# Patient Record
Sex: Male | Born: 1940 | Race: White | Hispanic: No | Marital: Married | Smoking: Former smoker
Health system: Southern US, Community
[De-identification: ages and names within clinical notes are randomized; demographics above are authoritative.]

## PROBLEM LIST (undated history)

## (undated) DIAGNOSIS — I10 Essential (primary) hypertension: Secondary | ICD-10-CM

## (undated) HISTORY — PX: KNEE SURGERY: SHX244

## (undated) HISTORY — PX: HIP SURGERY: SHX245

---

## 2006-02-08 ENCOUNTER — Encounter: Admission: RE | Admit: 2006-02-08 | Discharge: 2006-02-08 | Payer: Self-pay | Admitting: Family Medicine

## 2006-10-04 ENCOUNTER — Encounter: Admission: RE | Admit: 2006-10-04 | Discharge: 2006-10-04 | Payer: Self-pay | Admitting: Family Medicine

## 2007-06-28 ENCOUNTER — Encounter: Admission: RE | Admit: 2007-06-28 | Discharge: 2007-06-28 | Payer: Self-pay | Admitting: Family Medicine

## 2020-11-19 ENCOUNTER — Other Ambulatory Visit: Payer: Self-pay

## 2020-11-19 ENCOUNTER — Emergency Department (HOSPITAL_BASED_OUTPATIENT_CLINIC_OR_DEPARTMENT_OTHER): Payer: Medicare Other

## 2020-11-19 ENCOUNTER — Encounter (HOSPITAL_BASED_OUTPATIENT_CLINIC_OR_DEPARTMENT_OTHER): Payer: Self-pay

## 2020-11-19 ENCOUNTER — Emergency Department (HOSPITAL_BASED_OUTPATIENT_CLINIC_OR_DEPARTMENT_OTHER)
Admission: EM | Admit: 2020-11-19 | Discharge: 2020-11-20 | Disposition: A | Discharge status: Home / Self Care | Payer: Medicare Other | Attending: Emergency Medicine | Admitting: Emergency Medicine

## 2020-11-19 DIAGNOSIS — R42 Dizziness and giddiness: Secondary | ICD-10-CM | POA: Insufficient documentation

## 2020-11-19 DIAGNOSIS — R5383 Other fatigue: Secondary | ICD-10-CM | POA: Insufficient documentation

## 2020-11-19 DIAGNOSIS — I10 Essential (primary) hypertension: Secondary | ICD-10-CM | POA: Diagnosis not present

## 2020-11-19 DIAGNOSIS — Z87891 Personal history of nicotine dependence: Secondary | ICD-10-CM | POA: Insufficient documentation

## 2020-11-19 DIAGNOSIS — R0602 Shortness of breath: Secondary | ICD-10-CM | POA: Diagnosis present

## 2020-11-19 HISTORY — DX: Essential (primary) hypertension: I10

## 2020-11-19 LAB — URINALYSIS, ROUTINE W REFLEX MICROSCOPIC
Bilirubin Urine: NEGATIVE
Glucose, UA: NEGATIVE mg/dL
Hgb urine dipstick: NEGATIVE
Ketones, ur: NEGATIVE mg/dL
Leukocytes,Ua: NEGATIVE
Nitrite: NEGATIVE
Protein, ur: NEGATIVE mg/dL
Specific Gravity, Urine: 1.02 (ref 1.005–1.030)
pH: 6 (ref 5.0–8.0)

## 2020-11-19 LAB — D-DIMER, QUANTITATIVE: D-Dimer, Quant: 2.28 ug/mL-FEU — ABNORMAL HIGH (ref 0.00–0.50)

## 2020-11-19 LAB — TROPONIN I (HIGH SENSITIVITY): Troponin I (High Sensitivity): 5 ng/L (ref <18)

## 2020-11-19 LAB — CBC
HCT: 40.4 % (ref 39.0–52.0)
Hemoglobin: 13.6 g/dL (ref 13.0–17.0)
MCH: 30.2 pg (ref 26.0–34.0)
MCHC: 33.7 g/dL (ref 30.0–36.0)
MCV: 89.6 fL (ref 80.0–100.0)
Platelets: 276 10*3/uL (ref 150–400)
RBC: 4.51 MIL/uL (ref 4.22–5.81)
RDW: 13.6 % (ref 11.5–15.5)
WBC: 7.8 10*3/uL (ref 4.0–10.5)
nRBC: 0 % (ref 0.0–0.2)

## 2020-11-19 LAB — BASIC METABOLIC PANEL
Anion gap: 11 (ref 5–15)
BUN: 15 mg/dL (ref 8–23)
CO2: 21 mmol/L — ABNORMAL LOW (ref 22–32)
Calcium: 9.4 mg/dL (ref 8.9–10.3)
Chloride: 105 mmol/L (ref 98–111)
Creatinine, Ser: 1.13 mg/dL (ref 0.61–1.24)
GFR, Estimated: >60 mL/min (ref >60)
Glucose, Bld: 104 mg/dL — ABNORMAL HIGH (ref 70–99)
Potassium: 3.8 mmol/L (ref 3.5–5.1)
Sodium: 137 mmol/L (ref 135–145)

## 2020-11-19 MED ORDER — IOHEXOL 350 MG/ML SOLN
100.0000 mL | Freq: Once | INTRAVENOUS | Status: AC | PRN
  Administered 2020-11-19: 100 mL via INTRAVENOUS

## 2020-11-19 MED ORDER — HYDROCORTISONE NA SUCCINATE PF 250 MG IJ SOLR: 200.0000 mg | Freq: Once | INTRAMUSCULAR | Status: DC

## 2020-11-19 MED ORDER — DIPHENHYDRAMINE HCL 25 MG PO CAPS: 50.0000 mg | ORAL_CAPSULE | Freq: Once | ORAL | Status: AC

## 2020-11-19 MED ORDER — HYDROCORTISONE NA SUCCINATE PF 100 MG IJ SOLR
200.0000 mg | Freq: Once | INTRAMUSCULAR | Status: AC
  Administered 2020-11-19: 200 mg via INTRAVENOUS
  Filled 2020-11-19: qty 4

## 2020-11-19 MED ORDER — DIPHENHYDRAMINE HCL 50 MG/ML IJ SOLN
50.0000 mg | Freq: Once | INTRAMUSCULAR | Status: AC
  Administered 2020-11-19: 50 mg via INTRAVENOUS
  Filled 2020-11-19: qty 1

## 2020-11-19 NOTE — ED Triage Notes (Addendum)
Pt c/o feeling lightheaded, "woozy", SOB x 2 weeks-denies pain at present-pain to left UE 2 nights ago-pt c/o hearing and vision changes after knee surgery 10/28/20-NAD-steady gait

## 2020-11-19 NOTE — ED Notes (Signed)
ED Provider at bedside. 

## 2020-11-20 NOTE — ED Provider Notes (Signed)
[facility] EMERGENCY DEPARTMENT Provider Note   CSN: [id number] Arrival date & time: 11/19/20  1630     History Chief Complaint  Patient presents with  . Dizziness    Charles Cruz is a 80 y.o. male.  Presenting to the emergency room with concern for shortness of breath.  Patient states that over the past couple weeks he has felt somewhat fatigued, shortness of breath and somewhat lightheaded.  He currently has no acute complaints.  States that he has not had any associated chest pain.  Knee replacement surgery in November.  HPI     Past Medical History:  Diagnosis Date  . Hypertension     There are no problems to display for this patient.   Past Surgical History:  Procedure Laterality Date  . HIP SURGERY    . KNEE SURGERY         No family history on file.  Social History   Tobacco Use  . Smoking status: Former Games developer  . Smokeless tobacco: Never Used  Substance Use Topics  . Alcohol use: Yes    Comment: one/daily  . Drug use: Never    Home Medications Prior to Admission medications   Not on File    Allergies    Contrast media [iodinated diagnostic agents], Metrizamide, Other, Rivaroxaban, Atorvastatin, Celecoxib, Coenzyme q10, Lansoprazole, Pravastatin, Sulfa antibiotics, Valdecoxib, and Iohexol  Review of Systems   Review of Systems  Constitutional: Positive for fatigue. Negative for chills and fever.  HENT: Negative for ear pain and sore throat.   Eyes: Negative for pain and visual disturbance.  Respiratory: Positive for shortness of breath. Negative for cough.   Cardiovascular: Negative for chest pain and palpitations.  Gastrointestinal: Negative for abdominal pain and vomiting.  Genitourinary: Negative for dysuria and hematuria.  Musculoskeletal: Negative for arthralgias and back pain.  Skin: Negative for color change and rash.  Neurological: Positive for light-headedness. Negative for seizures and syncope.  All other systems  reviewed and are negative.   Physical Exam Updated Vital Signs BP (!) 159/79   Pulse 88   Temp (!) 97.4 F (36.3 C) (Oral)   Resp (!) 25   Ht 5\' 10"  (1.778 m)   Wt 9.072 kg   SpO2 95%   BMI 2.87 kg/m   Physical Exam Vitals and nursing note reviewed.  Constitutional:      Appearance: He is well-developed and well-nourished.  HENT:     Head: Normocephalic and atraumatic.  Eyes:     Conjunctiva/sclera: Conjunctivae normal.  Cardiovascular:     Rate and Rhythm: Normal rate and regular rhythm.     Heart sounds: No murmur heard.   Pulmonary:     Effort: Pulmonary effort is normal. No respiratory distress.     Breath sounds: Normal breath sounds.  Abdominal:     Palpations: Abdomen is soft.     Tenderness: There is no abdominal tenderness.  Musculoskeletal:        General: No deformity, signs of injury or edema.     Cervical back: Neck supple.  Skin:    General: Skin is warm and dry.  Neurological:     General: No focal deficit present.     Mental Status: He is alert and oriented to person, place, and time.  Psychiatric:        Mood and Affect: Mood and affect and mood normal.        Behavior: Behavior normal.     ED Results / Procedures /  Treatments   Labs (all labs ordered are listed, but only abnormal results are displayed) Labs Reviewed  BASIC METABOLIC PANEL - Abnormal; Notable for the following components:      Result Value   CO2 21 (*)    Glucose, Bld 104 (*)    All other components within normal limits  D-DIMER, QUANTITATIVE (NOT AT [facility]) - Abnormal; Notable for the following components:   D-Dimer, Quant 2.28 (*)    All other components within normal limits  CBC  URINALYSIS, ROUTINE W REFLEX MICROSCOPIC  TROPONIN I (HIGH SENSITIVITY)    EKG EKG Interpretation  Date/Time:  Wednesday November 19 2020 16:31:11 EST Ventricular Rate:  99 PR Interval:  184 QRS Duration: 134 QT Interval:  396 QTC Calculation: 508 R Axis:   -84 Text  Interpretation: Sinus rhythm with frequent Premature ventricular complexes and Fusion complexes Right bundle branch block Left anterior fascicular block  Bifascicular block  Abnormal ECG no prior for comparison Confirmed by Marianna Fuss ([id number]) on 11/19/2020 6:09:33 PM   Radiology DG Chest Portable 1 View  Result Date: 11/19/2020 CLINICAL DATA:  Cough, shortness of breath EXAM: PORTABLE CHEST 1 VIEW COMPARISON:  03/21/2018 FINDINGS: Heart is normal size. Bibasilar linear densities are stable since prior study compatible with scarring. Tortuous aorta with scattered calcifications. No acute confluent opacities or effusions. No acute bony abnormality. IMPRESSION: Bibasilar scarring. No active disease. Electronically Signed   By: Charlett Nose M.D.   On: 11/19/2020 17:12    Procedures Procedures   Medications Ordered in ED Medications  diphenhydrAMINE (BENADRYL) capsule 50 mg ( Oral See Alternative 11/19/20 2312)    Or  diphenhydrAMINE (BENADRYL) injection 50 mg (50 mg Intravenous Given 11/19/20 2312)  hydrocortisone sodium succinate (SOLU-CORTEF) 100 MG injection 200 mg (200 mg Intravenous Given 11/19/20 2012)  iohexol (OMNIPAQUE) 350 MG/ML injection 100 mL (100 mLs Intravenous Contrast Given 11/19/20 2359)    ED Course  I have reviewed the triage vital signs and the nursing notes.  Pertinent labs & imaging results that were available during my care of the patient were reviewed by me and considered in my medical decision making (see chart for details).    MDM Rules/Calculators/A&P                         80 year old male presenting to ER with concern for some shortness of breath and episodes of feeling lightheaded.  At present patient is well-appearing in no acute distress, no ongoing symptoms.  Given recent Ortho surgery, symptomatology, check D-dimer which was elevated.  CTA chest ordered to rule out PE.  The remainder of his labs were grossly stable.  If CT negative, anticipate discharge  home with plan to follow-up with primary.  Signed out to [facility] pending reassessment and CT results.   Final Clinical Impression(s) / ED Diagnoses Final diagnoses:  Shortness of breath    Rx / DC Orders ED Discharge Orders    None       Milagros Loll, MD 11/20/20 [phone]

## 2020-11-20 NOTE — ED Notes (Signed)
Patient transported to CT 

## 2020-11-20 NOTE — ED Provider Notes (Signed)
Nursing notes and vitals signs, including pulse oximetry, reviewed.  Summary of this visit's results, reviewed by myself:   EKG Interpretation  Date/Time:  Wednesday November 19 2020 16:31:11 EST Ventricular Rate:  99 PR Interval:  184 QRS Duration: 134 QT Interval:  396 QTC Calculation: 508 R Axis:   -84 Text Interpretation: Sinus rhythm with frequent Premature ventricular complexes and Fusion complexes Right bundle branch block Left anterior fascicular block  Bifascicular block  Abnormal ECG no prior for comparison Confirmed by Marianna Fuss ([id number]) on 11/19/2020 6:09:33 PM        Labs:  Results for orders placed or performed during the hospital encounter of 11/19/20 (from the past 24 hour(s))  Basic metabolic panel     Status: Abnormal   Collection Time: 11/19/20  4:50 PM  Result Value Ref Range   Sodium 137 135 - 145 mmol/L   Potassium 3.8 3.5 - 5.1 mmol/L   Chloride 105 98 - 111 mmol/L   CO2 21 (L) 22 - 32 mmol/L   Glucose, Bld 104 (H) 70 - 99 mg/dL   BUN 15 8 - 23 mg/dL   Creatinine, Ser 9.73 0.61 - 1.24 mg/dL   Calcium 9.4 8.9 - 53.2 mg/dL   GFR, Estimated >99 >24 mL/min   Anion gap 11 5 - 15  CBC     Status: None   Collection Time: 11/19/20  4:50 PM  Result Value Ref Range   WBC 7.8 4.0 - 10.5 K/uL   RBC 4.51 4.22 - 5.81 MIL/uL   Hemoglobin 13.6 13.0 - 17.0 g/dL   HCT 26.8 34.1 - 96.2 %   MCV 89.6 80.0 - 100.0 fL   MCH 30.2 26.0 - 34.0 pg   MCHC 33.7 30.0 - 36.0 g/dL   RDW 22.9 79.8 - 92.1 %   Platelets 276 150 - 400 K/uL   nRBC 0.0 0.0 - 0.2 %  Urinalysis, Routine w reflex microscopic     Status: None   Collection Time: 11/19/20  4:50 PM  Result Value Ref Range   Color, Urine YELLOW YELLOW   APPearance CLEAR CLEAR   Specific Gravity, Urine 1.020 1.005 - 1.030   pH 6.0 5.0 - 8.0   Glucose, UA NEGATIVE NEGATIVE mg/dL   Hgb urine dipstick NEGATIVE NEGATIVE   Bilirubin Urine NEGATIVE NEGATIVE   Ketones, ur NEGATIVE NEGATIVE mg/dL   Protein, ur NEGATIVE  NEGATIVE mg/dL   Nitrite NEGATIVE NEGATIVE   Leukocytes,Ua NEGATIVE NEGATIVE  Troponin I (High Sensitivity)     Status: None   Collection Time: 11/19/20  4:50 PM  Result Value Ref Range   Troponin I (High Sensitivity) 5 <18 ng/L  D-dimer, quantitative (not at [facility])     Status: Abnormal   Collection Time: 11/19/20  7:31 PM  Result Value Ref Range   D-Dimer, Quant 2.28 (H) 0.00 - 0.50 ug/mL-FEU    Imaging Studies: CT Angio Chest PE W and/or Wo Contrast  Result Date: 11/20/2020 CLINICAL DATA:  Shortness of breath. EXAM: CT ANGIOGRAPHY CHEST WITH CONTRAST TECHNIQUE: Multidetector CT imaging of the chest was performed using the standard protocol during bolus administration of intravenous contrast. Multiplanar CT image reconstructions and MIPs were obtained to evaluate the vascular anatomy. CONTRAST:  OMNIPAQUE IOHEXOL 350 MG/ML SOLN COMPARISON:  CT chest dated 06/28/2007. CT chest dated Mar 19, 2009. FINDINGS: Cardiovascular: Contrast injection is sufficient to demonstrate satisfactory opacification of the pulmonary arteries to the segmental level. There is no pulmonary embolus or evidence of right heart  strain. The size of the main pulmonary artery is normal. Cardiomegaly with coronary artery calcification. The course and caliber of the aorta are normal. There is mild atherosclerotic calcification. Opacification decreased due to pulmonary arterial phase contrast bolus timing. Mediastinum/Nodes: -- No mediastinal lymphadenopathy. -- No hilar lymphadenopathy. -- No axillary lymphadenopathy. -- No supraclavicular lymphadenopathy. -- Normal thyroid gland where visualized. -  Unremarkable esophagus. Lungs/Pleura: Emphysematous changes are noted. There are few ground-glass airspace opacities bilaterally, most evident in the bilateral lower lobes. There is no pneumothorax. The trachea is unremarkable. There is no significant pleural effusion. Upper Abdomen: Contrast bolus timing is not optimized for  evaluation of the abdominal organs. The visualized portions of the organs of the upper abdomen are normal. Musculoskeletal: No chest wall abnormality. No bony spinal canal stenosis. Review of the MIP images confirms the above findings. IMPRESSION: 1. No evidence of pulmonary embolus. 2. There are few ground-glass airspace opacities bilaterally, most evident in the bilateral lower lobes, concerning for an atypical infectious process such as viral pneumonia. 3. Cardiomegaly with coronary artery disease. Aortic Atherosclerosis (ICD10-I70.0) and Emphysema (ICD10-J43.9). Electronically Signed   By: Katherine Mantle M.D.   On: 11/20/2020 00:31   DG Chest Portable 1 View  Result Date: 11/19/2020 CLINICAL DATA:  Cough, shortness of breath EXAM: PORTABLE CHEST 1 VIEW COMPARISON:  03/21/2018 FINDINGS: Heart is normal size. Bibasilar linear densities are stable since prior study compatible with scarring. Tortuous aorta with scattered calcifications. No acute confluent opacities or effusions. No acute bony abnormality. IMPRESSION: Bibasilar scarring. No active disease. Electronically Signed   By: Charlett Nose M.D.   On: 11/19/2020 17:12      Keely Drennan, Jonny Ruiz, MD 11/20/20 [phone]

## 2022-02-01 IMAGING — CT CT ANGIO CHEST
2 of 9 series · 18 of 36 positions shown · IV contrast (Omnipaque)
Comparison: CT chest dated 06/28/2007. CT chest dated March 19, 2009.

CLINICAL DATA: Shortness of breath.

EXAM:
CT ANGIOGRAPHY CHEST WITH CONTRAST
TECHNIQUE: Multidetector CT imaging of the chest was performed using the
standard protocol during bolus administration of intravenous
contrast. Multiplanar CT image reconstructions and MIPs were
obtained to evaluate the vascular anatomy.
CONTRAST:  100mL OMNIPAQUE IOHEXOL 350 MG/ML SOLN

[Series 8: pe thins · axial · 0.96mm/px · z∈[+30,+300]mm · 17 of 301 slices shown]
[im 16/301  lung]
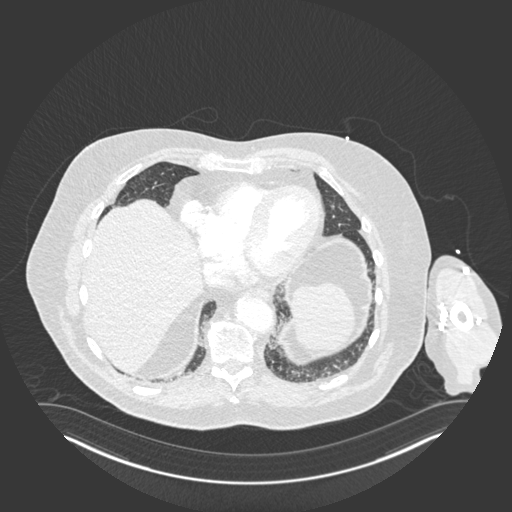
[im 32/301  mediastinal]
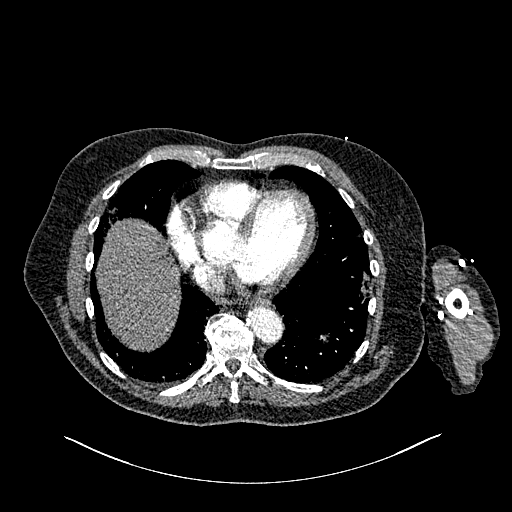
[im 48/301  lung]
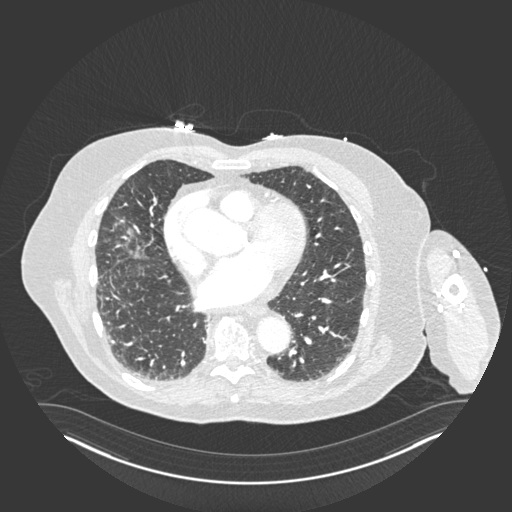
[im 64/301  mediastinal]
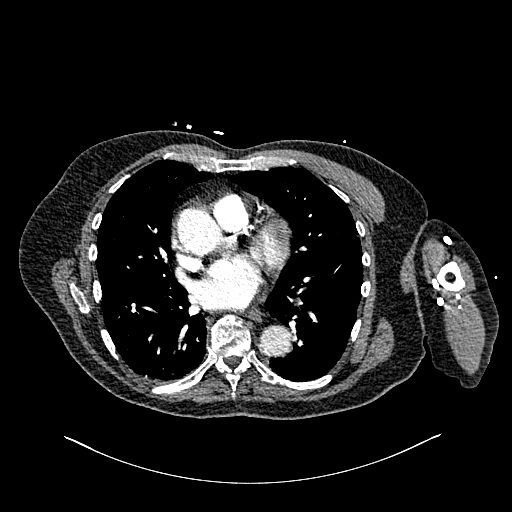
[im 79/301  lung]
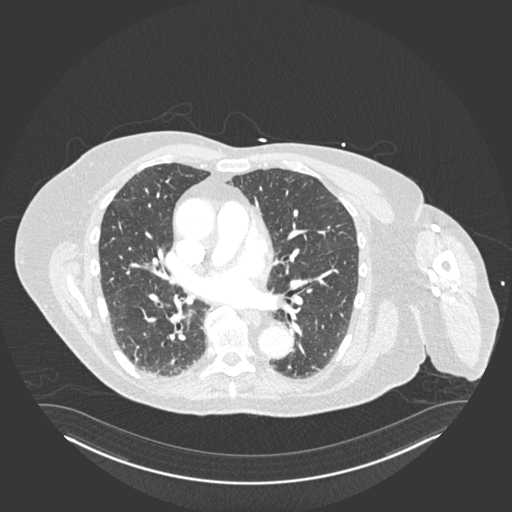
[im 95/301  mediastinal]
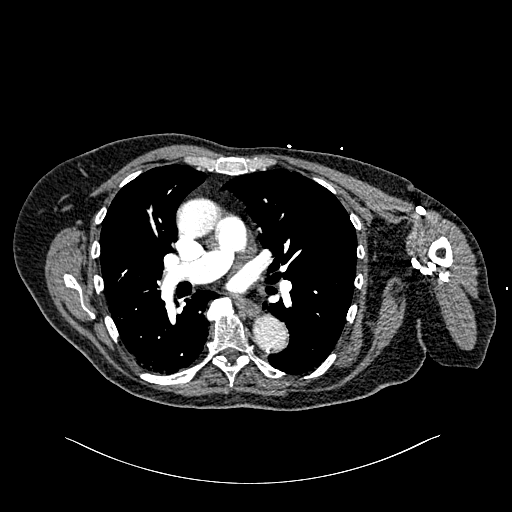
[im 111/301  lung]
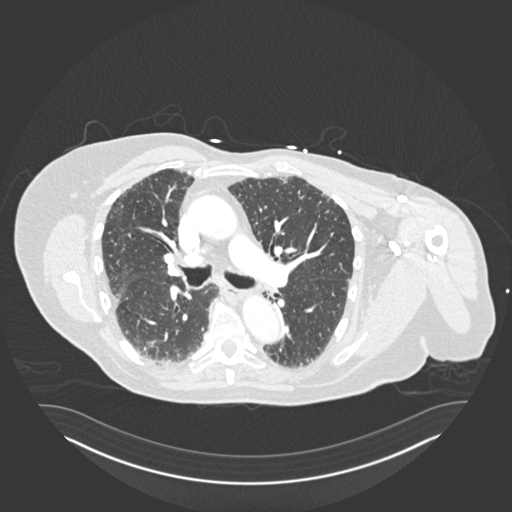
[im 127/301  mediastinal]
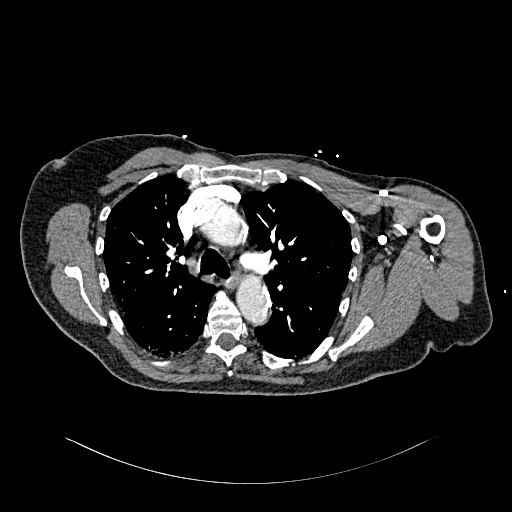
[im 158/301  lung]
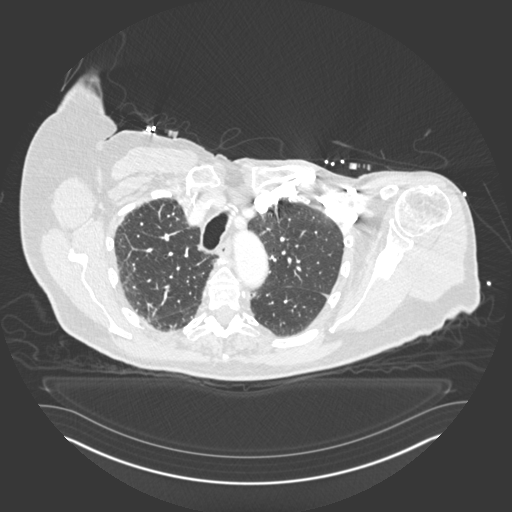
[im 174/301  mediastinal]
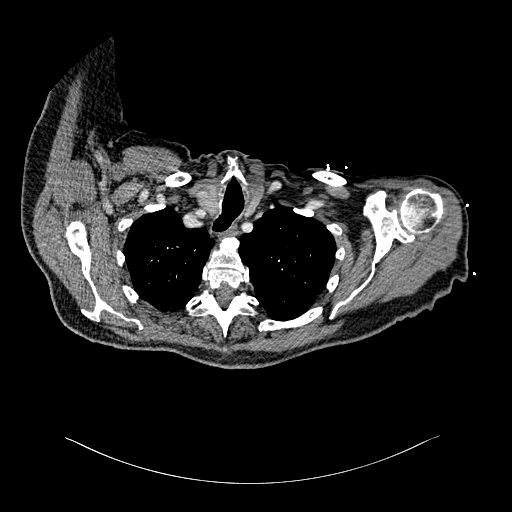
[im 190/301  lung]
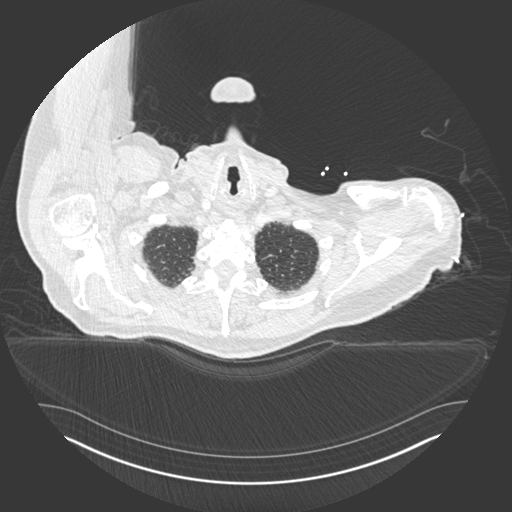
[im 206/301  mediastinal]
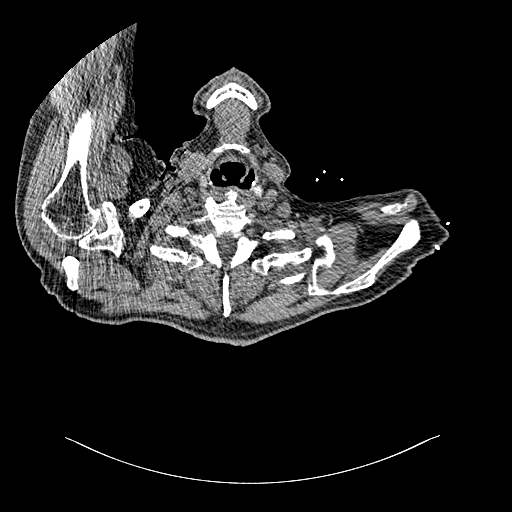
[im 222/301  lung]
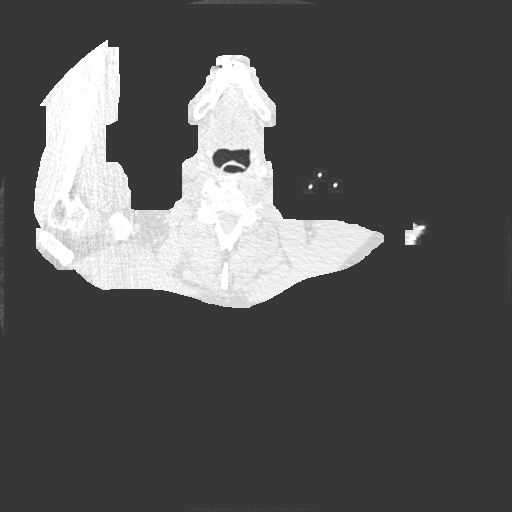
[im 237/301  mediastinal]
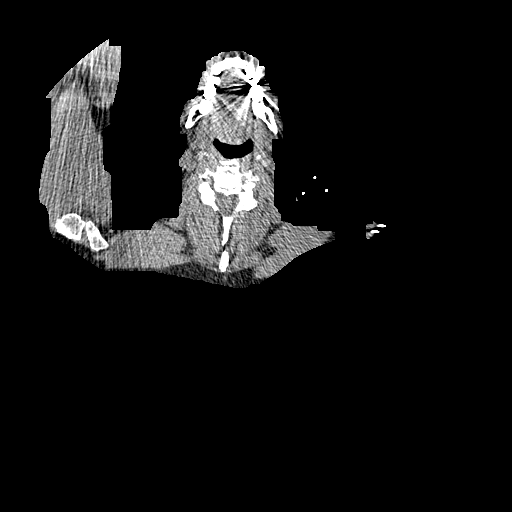
[im 253/301  lung]
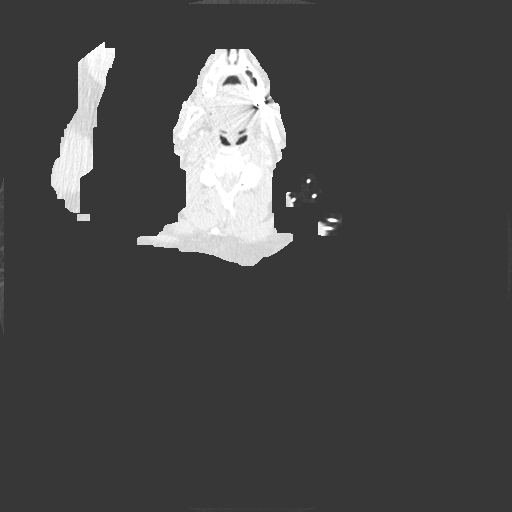
[im 269/301  mediastinal]
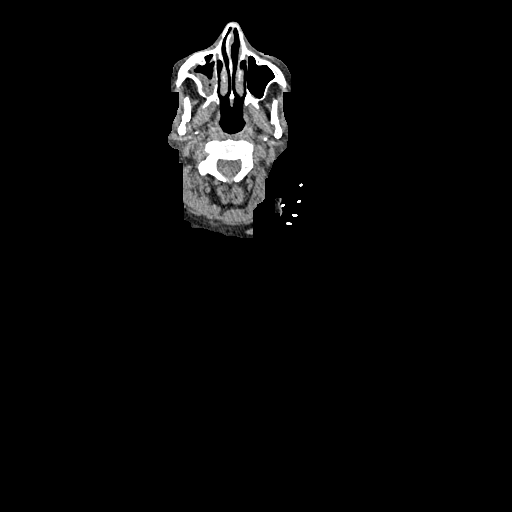
[im 285/301  lung]
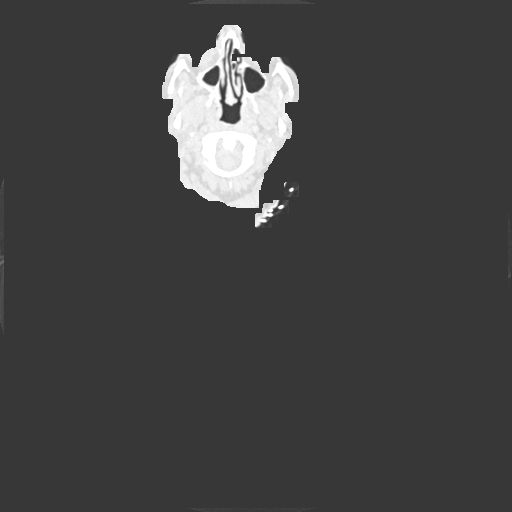

[Series 9: pe coronal mpr · coronal · 0.58mm/px · 1 of 139 slices shown]
[im 70/139  mediastinal]
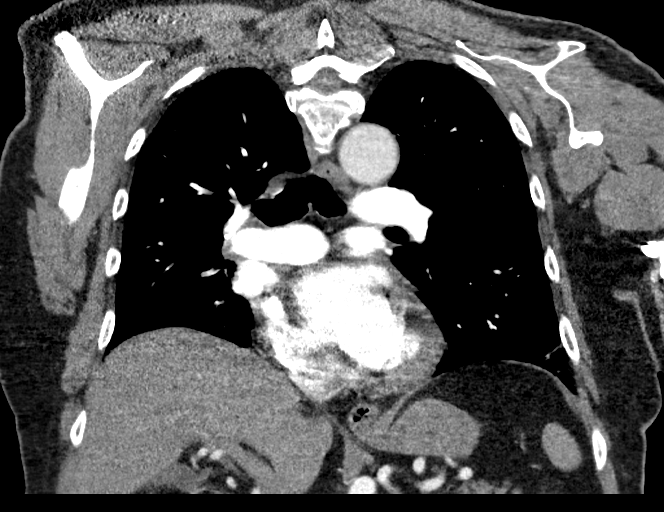

[18 of 36 positions shown; findings below may reference images not displayed]

FINDINGS: Cardiovascular: Contrast injection is sufficient to demonstrate
satisfactory opacification of the pulmonary arteries to the
segmental level. There is no pulmonary embolus or evidence of right
heart strain. The size of the main pulmonary artery is normal.
Cardiomegaly with coronary artery calcification. The course and
caliber of the aorta are normal. There is mild atherosclerotic
calcification. Opacification decreased due to pulmonary arterial
phase contrast bolus timing.

Mediastinum/Nodes:

-- No mediastinal lymphadenopathy.

-- No hilar lymphadenopathy.

-- No axillary lymphadenopathy.

-- No supraclavicular lymphadenopathy.

-- Normal thyroid gland where visualized.

-  Unremarkable esophagus.

Lungs/Pleura: Emphysematous changes are noted. There are few
ground-glass airspace opacities bilaterally, most evident in the
bilateral lower lobes. There is no pneumothorax. The trachea is
unremarkable. There is no significant pleural effusion.

Upper Abdomen: Contrast bolus timing is not optimized for evaluation
of the abdominal organs. The visualized portions of the organs of
the upper abdomen are normal.

Musculoskeletal: No chest wall abnormality. No bony spinal canal
stenosis.

Review of the MIP images confirms the above findings.
IMPRESSION: 1. No evidence of pulmonary embolus.
2. There are few ground-glass airspace opacities bilaterally, most
evident in the bilateral lower lobes, concerning for an atypical
infectious process such as viral pneumonia.
3. Cardiomegaly with coronary artery disease.

Aortic Atherosclerosis ([id number].E) and Emphysema ([id number].S).
